# Patient Record
Sex: Female | Born: 1970 | Race: White | Hispanic: No | Marital: Married
Health system: Southern US, Community
[De-identification: ages and names within clinical notes are randomized; demographics above are authoritative.]

---

## 2003-08-28 ENCOUNTER — Inpatient Hospital Stay (HOSPITAL_COMMUNITY): Admission: AD | Admit: 2003-08-28 | Discharge: 2003-08-28 | Payer: Self-pay | Admitting: Obstetrics & Gynecology

## 2003-09-03 ENCOUNTER — Encounter: Admission: RE | Admit: 2003-09-03 | Discharge: 2003-09-03 | Payer: Self-pay | Admitting: *Deleted

## 2003-09-08 ENCOUNTER — Ambulatory Visit (HOSPITAL_COMMUNITY): Admission: RE | Admit: 2003-09-08 | Discharge: 2003-09-08 | Payer: Self-pay | Admitting: *Deleted

## 2003-09-17 ENCOUNTER — Encounter: Admission: RE | Admit: 2003-09-17 | Discharge: 2003-09-17 | Payer: Self-pay | Admitting: *Deleted

## 2003-10-02 ENCOUNTER — Ambulatory Visit (HOSPITAL_COMMUNITY): Admission: RE | Admit: 2003-10-02 | Discharge: 2003-10-02 | Payer: Self-pay | Admitting: Obstetrics and Gynecology

## 2003-10-15 ENCOUNTER — Encounter: Admission: RE | Admit: 2003-10-15 | Discharge: 2003-10-15 | Payer: Self-pay | Admitting: *Deleted

## 2003-10-29 ENCOUNTER — Encounter: Admission: RE | Admit: 2003-10-29 | Discharge: 2003-10-29 | Payer: Self-pay | Admitting: *Deleted

## 2003-11-12 ENCOUNTER — Encounter: Admission: RE | Admit: 2003-11-12 | Discharge: 2003-11-12 | Payer: Self-pay | Admitting: *Deleted

## 2004-09-20 IMAGING — US US OB COMP +14 WK
1 series · 14 of 28 positions shown · non-contrast
Comparison: none

CLINICAL DATA: Assess cervix and dating.

[Series 1: us ob comp +14 wk · 14 of 65 slices shown]
[im 3/65]
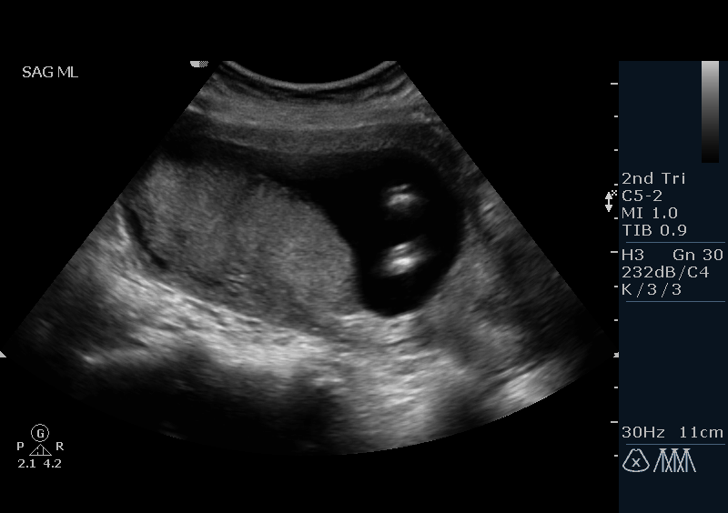
[im 8/65]
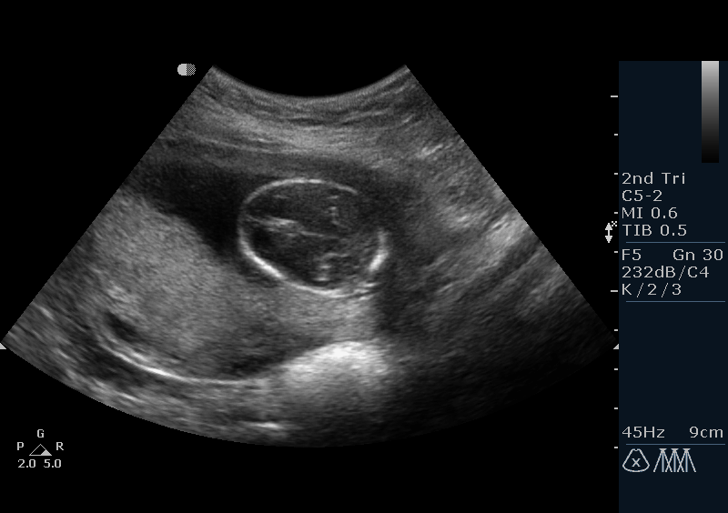
[im 12/65]
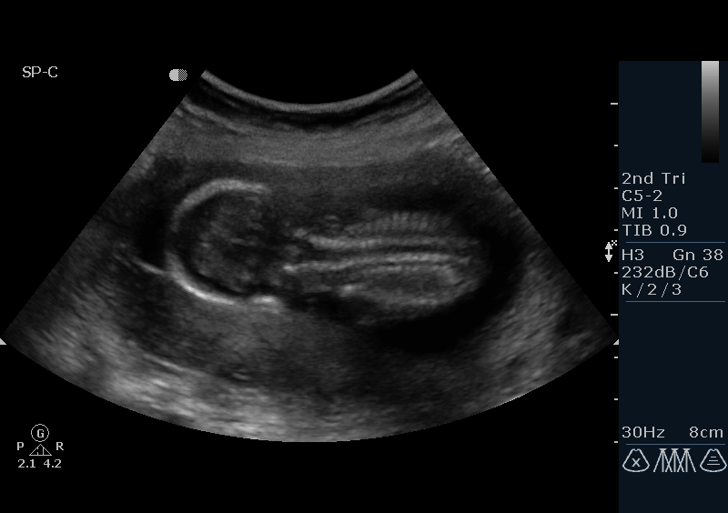
[im 17/65]
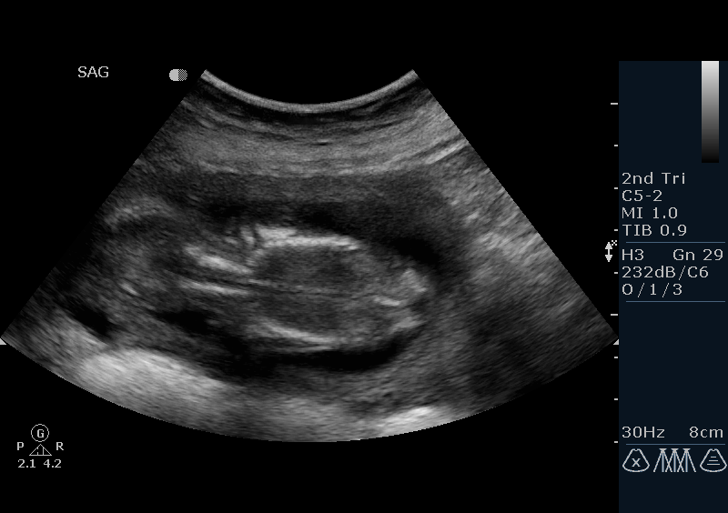
[im 22/65]
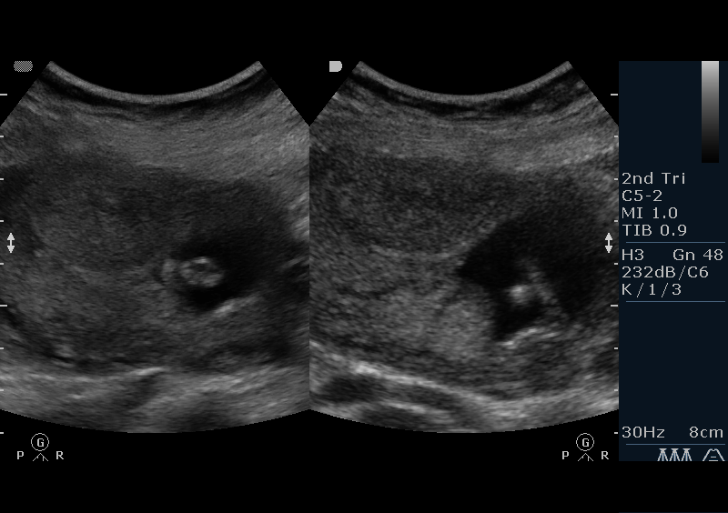
[im 27/65]
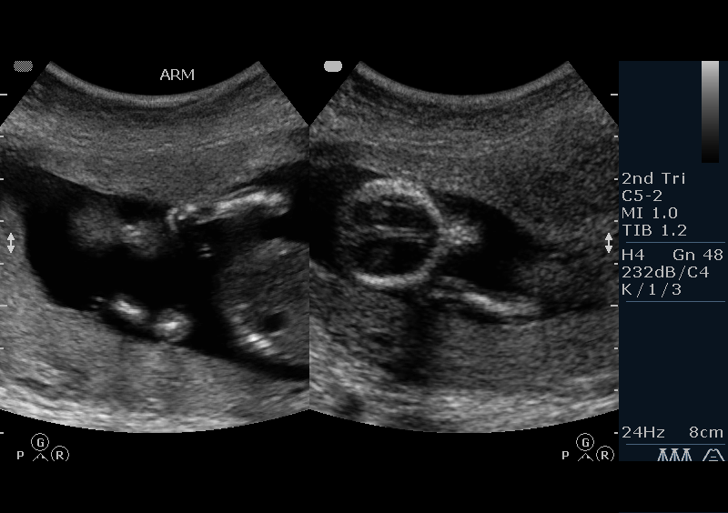
[im 31/65]
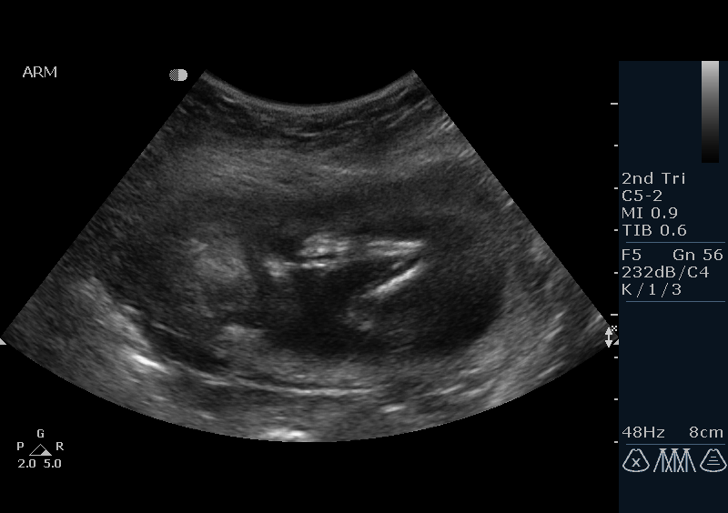
[im 36/65]
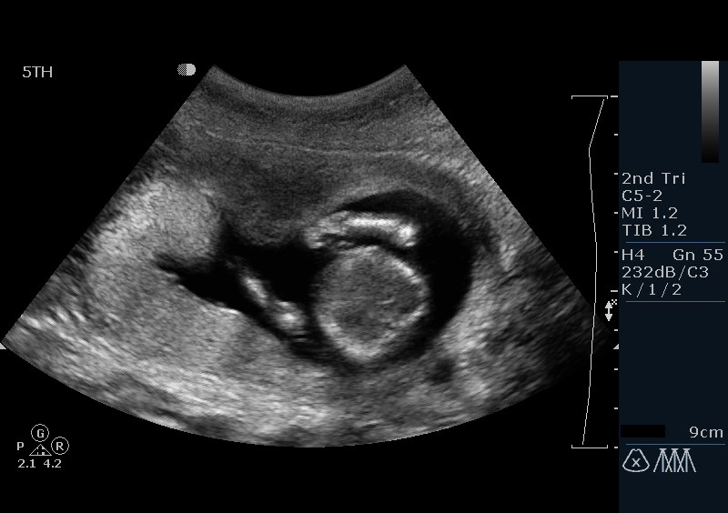
[im 41/65]
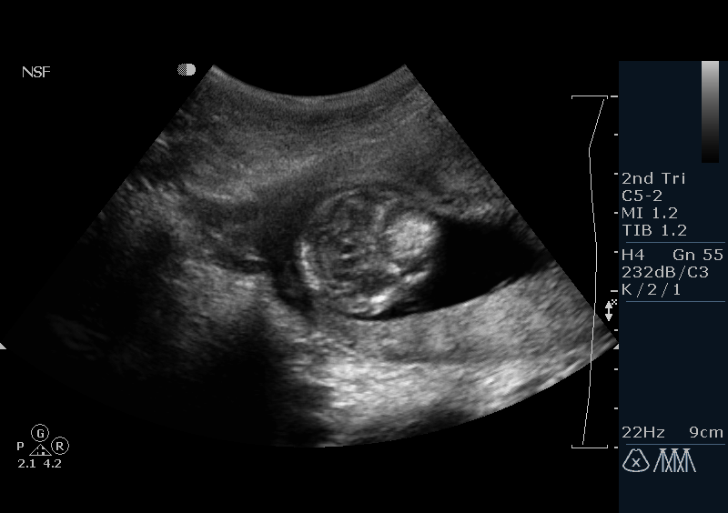
[im 46/65]
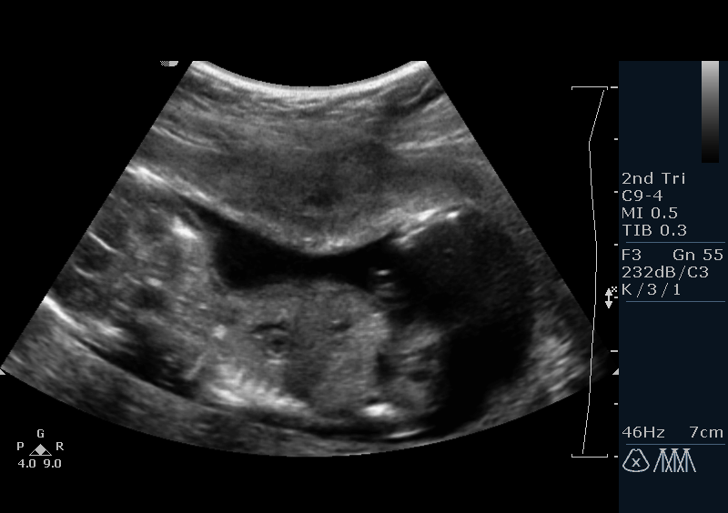
[im 50/65]
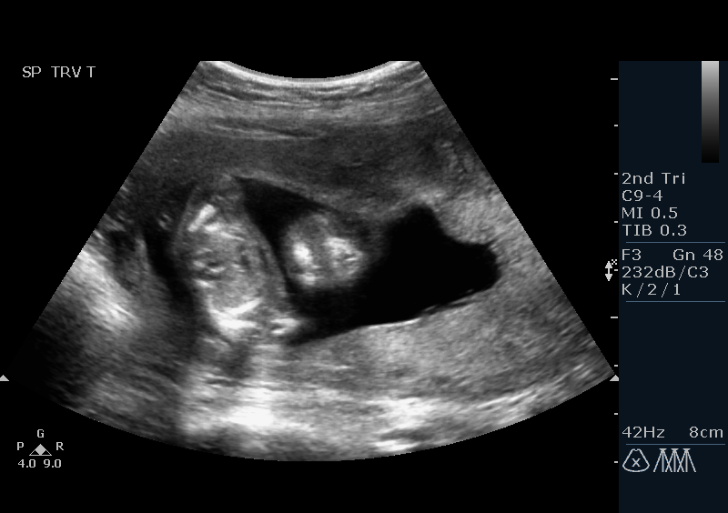
[im 55/65]
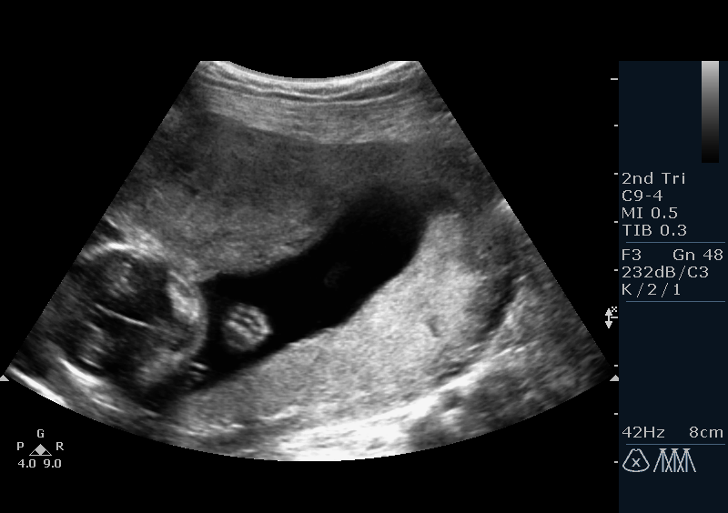
[im 60/65]
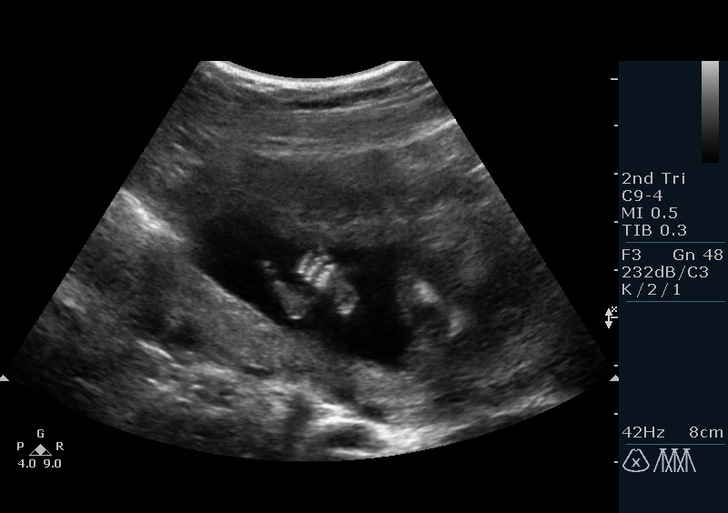
[im 65/65]
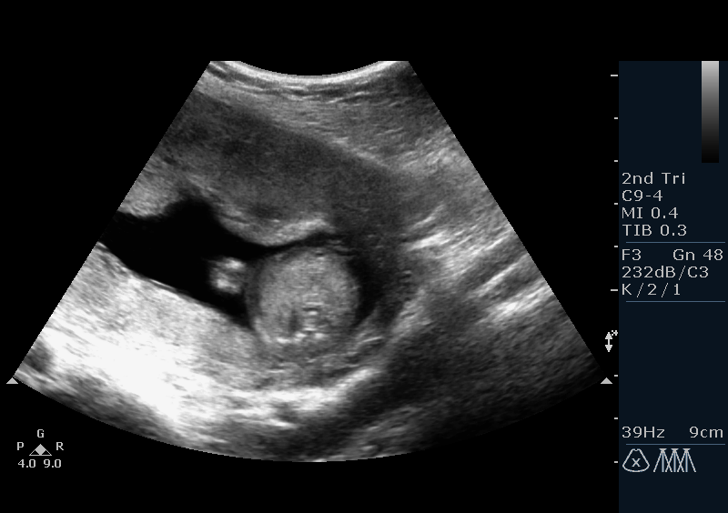

[14 of 28 positions shown; findings below may reference images not displayed]

OBSTETRICAL ULTRASOUND
Number of Fetuses:  1
Heart Rate:  166
Movement:  Yes
Breathing:  No  
Presentation:  Breech
Placental Location:  Posterior
Grade:  I
Previa:  No
Amniotic Fluid (Subjective):  Normal
Amniotic Fluid (Objective):   4 cm Vertical pocket 

FETAL BIOMETRY
BPD:  2.8 cm   15 w 1 d
HC:  10.7 cm  15 w 0 d
AC:  8.9 cm   15 w 0 d
FL:  1.5 cm   14 w 4 d

MEAN GA:  14 w 6 d

  FETAL ANATOMY
Lateral Ventricles:    Visualized 
Thalami/CSP:      Visualized   
Posterior Fossa:  Visualized 
Nuchal Region:    Visualized 
Spine:      Visualized 
4 Chamber Heart on Left:      Visualized 
Stomach on Left:      Visualized 
3 Vessel Cord:    Visualized 
Cord Insertion site:    Visualized 
Kidneys:  Visualized 
Bladder:  Visualized 
Extremities:      Visualized (early)

ADDITIONAL ANATOMY VISUALIZED:   orbits, heel, 5th digit, and male genitalia

Evaluation limited by:  Fetal position and early gestational age 

MATERNAL FINDINGS
Cervix:   3.2 cm Transabdominally
IMPRESSION: Single intrauterine pregnancy demonstrating an estimated gestational age by ultrasound of 14 weeks 6 days.  This is 5 weeks and 5 days behind expected estimated gestational age by LMP of 20 weeks and 4 days and suggests inaccurate dating by LMP.  
An incomplete anatomic exam was possible today due to early gestational age.  Follow-up evaluation in three weeks time would be recommended for an improved anatomic assessment.

## 2004-10-01 IMAGING — US US OB TRANSVAGINAL
1 series · 5 of 5 positions shown · non-contrast
Comparison: none

CLINICAL DATA: Evaluate cervix.  16 weeks estimated gestational age.
 TRANSVAGINAL OBSTETRICAL ULTRASOUND:
 Limited views of the gravid cervix were obtained.  A normal cervical length of 4.0 cm is seen.  No dilatation at the level of the internal cervical os is seen.  Fetal anatomy was not assessed and follow-up evaluation between 18 and 20 weeks would be recommended for full anatomic visualization.
 IMPRESSION
 Normal cervical length.

[Series 1: us ob transvaginal · 5 of 5 slices shown]
[im 1/5]
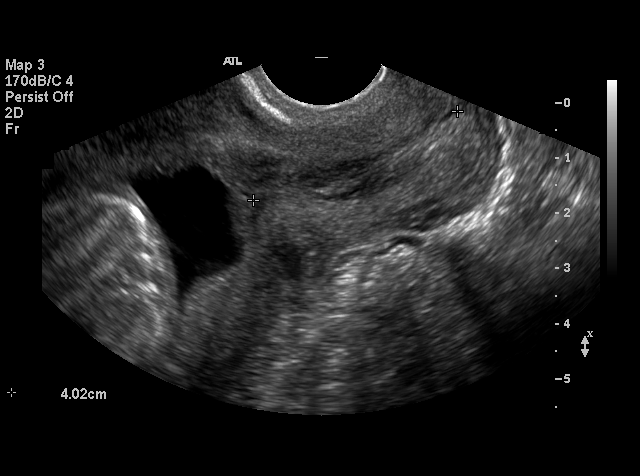
[im 2/5]
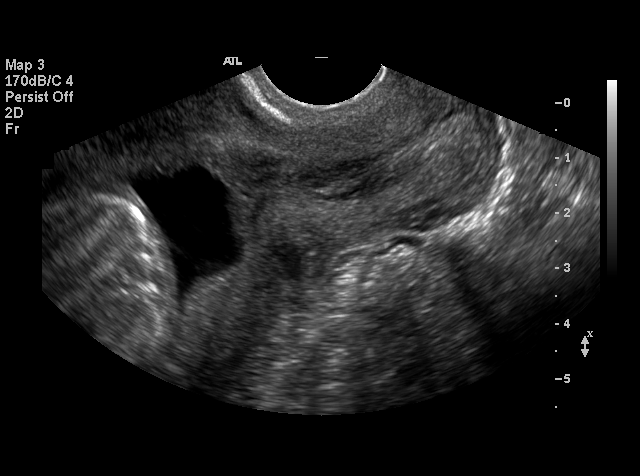
[im 3/5]
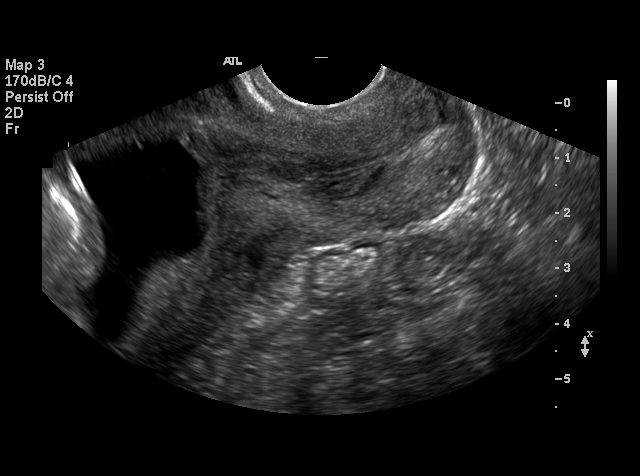
[im 4/5]
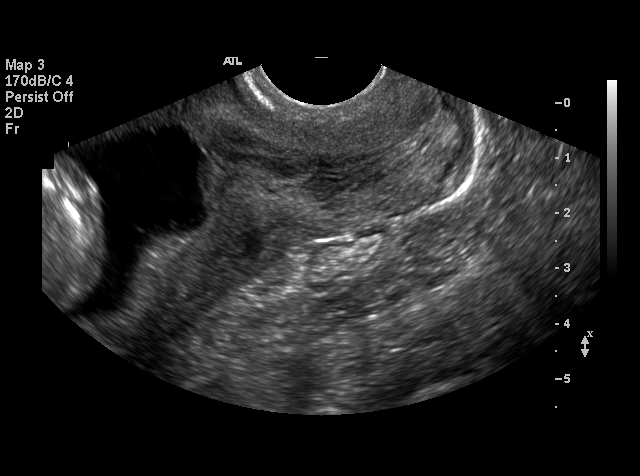
[im 5/5]
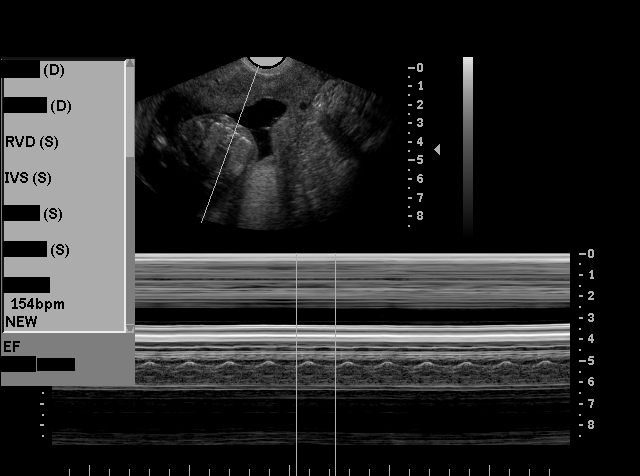

[5 of 5 positions shown; findings below may reference images not displayed]

## 2010-06-06 ENCOUNTER — Encounter: Payer: Self-pay | Admitting: *Deleted

## 2016-07-05 ENCOUNTER — Ambulatory Visit (HOSPITAL_COMMUNITY): Payer: Self-pay | Admitting: Licensed Clinical Social Worker

## 2016-07-26 ENCOUNTER — Ambulatory Visit (INDEPENDENT_AMBULATORY_CARE_PROVIDER_SITE_OTHER): Payer: Medicaid Other | Admitting: Licensed Clinical Social Worker

## 2016-07-26 DIAGNOSIS — F439 Reaction to severe stress, unspecified: Secondary | ICD-10-CM | POA: Diagnosis not present

## 2016-07-26 DIAGNOSIS — F331 Major depressive disorder, recurrent, moderate: Secondary | ICD-10-CM | POA: Diagnosis not present

## 2016-07-26 DIAGNOSIS — F411 Generalized anxiety disorder: Secondary | ICD-10-CM | POA: Diagnosis not present

## 2016-07-27 ENCOUNTER — Encounter (HOSPITAL_COMMUNITY): Payer: Self-pay | Admitting: Licensed Clinical Social Worker

## 2016-07-27 DIAGNOSIS — F411 Generalized anxiety disorder: Secondary | ICD-10-CM | POA: Insufficient documentation

## 2016-07-27 DIAGNOSIS — F331 Major depressive disorder, recurrent, moderate: Secondary | ICD-10-CM | POA: Insufficient documentation

## 2016-07-27 DIAGNOSIS — F439 Reaction to severe stress, unspecified: Secondary | ICD-10-CM | POA: Insufficient documentation
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
## 2016-08-15 ENCOUNTER — Ambulatory Visit (INDEPENDENT_AMBULATORY_CARE_PROVIDER_SITE_OTHER): Payer: Medicaid Other | Admitting: Licensed Clinical Social Worker

## 2016-08-15 DIAGNOSIS — F411 Generalized anxiety disorder: Secondary | ICD-10-CM

## 2016-08-15 DIAGNOSIS — F439 Reaction to severe stress, unspecified: Secondary | ICD-10-CM

## 2016-08-15 DIAGNOSIS — F331 Major depressive disorder, recurrent, moderate: Secondary | ICD-10-CM
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
## 2016-09-06 ENCOUNTER — Ambulatory Visit (HOSPITAL_COMMUNITY): Payer: Self-pay | Admitting: Licensed Clinical Social Worker

## 2016-10-04 ENCOUNTER — Ambulatory Visit (INDEPENDENT_AMBULATORY_CARE_PROVIDER_SITE_OTHER): Payer: Medicaid Other | Admitting: Licensed Clinical Social Worker

## 2016-10-04 DIAGNOSIS — F411 Generalized anxiety disorder: Secondary | ICD-10-CM | POA: Diagnosis not present

## 2016-10-04 DIAGNOSIS — F439 Reaction to severe stress, unspecified: Secondary | ICD-10-CM

## 2016-10-04 DIAGNOSIS — F331 Major depressive disorder, recurrent, moderate: Secondary | ICD-10-CM | POA: Diagnosis not present

## 2016-10-04 NOTE — Progress Notes (Signed)
   THERAPIST PROGRESS NOTE  Session Time: 11:05am-12:10pm  Participation Level: Active  Behavioral Response: Casual  Alert  Pressured speech  Mostly euthymic  Type of Therapy: Individual Therapy  Treatment Goals addressed: Increase self-confidence and decrease guilt  Interventions: Identifying thoughts and feelings, exploring setting boundaries in relationships  Suicidal/Homicidal: Denied both  Therapist Interventions: Gathered information about significant events and changes in mood and functioning since last seen in early April.  Sought clarification of patient's thoughts and feelings regarding her relationships with different individuals in her life.  Prompted her to consider how distancing herself from a friend who has been controlling and also encouraged patient to engage in impulsive behavior which patient claims is not characteristic of her.  Commented on how patient's life seems to be complicated by the fact that she holds onto relationships with people regardless of how they treat her.      Summary: Described in detail one particular night when she was very impulsive and easily influenced by others.  Indicated she feels conflicted about her relationships with the different people involved.  At one point she described the events of that night as being "the best night of her life."  At other points she indicated she felt taken advantage of.   Reflected on how she is attracted to men who are "bad boys" and less so to men who treat her well.  There is one man in particular she says she is in love with but he has made it clear to her he doesn't want to be in a committed relationship.   Commented on how she has thought about moving away from the area so that she is not influenced by these different people so much.  Noted [place] so she is well known in the area.  A part of her would like to get a fresh start.     Plan: Consider having her complete a PCL-5 to assess  for PTSD related symptoms.  Diagnosis: Major Depressive Disorder, recurrent, moderate                          Trauma and stressor related disorder                          GAD    Marilu FavreSolomon, Sarah A, LCSW 10/04/2016

## 2016-10-27 ENCOUNTER — Ambulatory Visit (INDEPENDENT_AMBULATORY_CARE_PROVIDER_SITE_OTHER): Payer: Medicaid Other | Admitting: Licensed Clinical Social Worker

## 2016-10-27 DIAGNOSIS — F411 Generalized anxiety disorder: Secondary | ICD-10-CM | POA: Diagnosis not present

## 2016-10-27 DIAGNOSIS — F439 Reaction to severe stress, unspecified: Secondary | ICD-10-CM | POA: Diagnosis not present

## 2016-10-27 DIAGNOSIS — F331 Major depressive disorder, recurrent, moderate: Secondary | ICD-10-CM | POA: Diagnosis not present

## 2016-10-27 NOTE — Progress Notes (Signed)
   THERAPIST PROGRESS NOTE  Session Time: 10:15am-11:00am  Participation Level: Active  Behavioral Response: Casual  Alert  Anxious  Type of Therapy: Individual Therapy  Treatment Goals addressed: Increase self-confidence and decrease guilt  Interventions: Problem solving  Suicidal/Homicidal: Denied both  Therapist Interventions: Explored patient's feelings for a man who has been a part of her life for a long time.  Discussed pros and cons of reaching out to him in an effort to reestablish their relationship.  Encouraged patient to talk about how she has come to be so trusting of this individual when in general it is hard for her to trust anyone.  Provided positive feedback regarding patient's idea to communicate her thoughts and feelings to him in a letter.     Summary:  Reports she has been in love with this man for many years.  The last communication she had from him was not pleasant.  Afterwards she tried texting him but they wouldn't go through.  Since then she hasn't been able to stop thinking about him.  Described how when she spends time with him she feels safe, like she can be herself.  The situation is complicated by the fact that this man is also friends with her (ex) husband and brother-in-law.  Believes that he is afraid of them finding out about the intimate nature of their relationship.  Patient talked about feeling like it isn't fair for her to be miserable just because they wouldn't approve of the relationship.       Plan: Consider having her complete a PCL-5 to assess for PTSD related symptoms.  Diagnosis: Major Depressive Disorder, recurrent, moderate                          Trauma and stressor related disorder                          GAD    Marilu FavreSolomon, Karmin Kasprzak A, LCSW 10/27/2016

## 2016-11-15 ENCOUNTER — Ambulatory Visit (HOSPITAL_COMMUNITY): Payer: Self-pay | Admitting: Licensed Clinical Social Worker

## 2016-11-18 ENCOUNTER — Ambulatory Visit (INDEPENDENT_AMBULATORY_CARE_PROVIDER_SITE_OTHER): Payer: Medicaid Other | Admitting: Licensed Clinical Social Worker

## 2016-11-18 DIAGNOSIS — F411 Generalized anxiety disorder: Secondary | ICD-10-CM | POA: Diagnosis not present

## 2016-11-18 DIAGNOSIS — F439 Reaction to severe stress, unspecified: Secondary | ICD-10-CM

## 2016-11-18 DIAGNOSIS — F331 Major depressive disorder, recurrent, moderate: Secondary | ICD-10-CM | POA: Diagnosis not present
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
## 2016-12-06 ENCOUNTER — Ambulatory Visit (INDEPENDENT_AMBULATORY_CARE_PROVIDER_SITE_OTHER): Payer: Medicaid Other | Admitting: Licensed Clinical Social Worker

## 2016-12-06 DIAGNOSIS — F4310 Post-traumatic stress disorder, unspecified: Secondary | ICD-10-CM

## 2016-12-06 DIAGNOSIS — F411 Generalized anxiety disorder: Secondary | ICD-10-CM

## 2016-12-06 DIAGNOSIS — F331 Major depressive disorder, recurrent, moderate: Secondary | ICD-10-CM | POA: Diagnosis not present
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
## 2016-12-23 ENCOUNTER — Ambulatory Visit (INDEPENDENT_AMBULATORY_CARE_PROVIDER_SITE_OTHER): Payer: Medicaid Other | Admitting: Licensed Clinical Social Worker

## 2016-12-23 DIAGNOSIS — F4310 Post-traumatic stress disorder, unspecified: Secondary | ICD-10-CM

## 2016-12-23 DIAGNOSIS — F331 Major depressive disorder, recurrent, moderate: Secondary | ICD-10-CM

## 2016-12-23 DIAGNOSIS — F411 Generalized anxiety disorder: Secondary | ICD-10-CM

## 2016-12-26 NOTE — Progress Notes (Signed)
   THERAPIST PROGRESS NOTE  Session Time: 10:03-11:05am  Participation Level: Active  Behavioral Response: Casual  Alert  Anxious  Type of Therapy: Individual Therapy  Treatment Goals addressed: Increase self-confidence and decrease guilt  Interventions: Assessment, Strengths based  Suicidal/Homicidal: Denied both  Therapist Interventions:   Discussed how patient is dissatisfied with her work life.  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  Suggested that her obsessive thoughts about her relationship with her husband are getting in the way of her being able to focus on what she needs.  Explained that it is possible to learn to let go of obsessive thoughts by practicing something called mindfulness.  Suggested they devote her next session to learning about mindfulness.     Summary:  Reported "I'm not happy at my job.  I feel like I don't belong.". Admitted she misses working at the hospital.  Reflected on how on the psych ward she felt valuable and appreciated.  Her CNA license expired over two years ago.  At one point she pursued renewing it but was discouraged from doing so by her husband.  Noted he was not consistent in his support of her working.  Indicated she feels like she can't pursue what she really wants because she is always trying to consider what her husband would want her to do.  Reported she has cried herself to sleep every night as she thinks about the state of her marriage.  Agreed it would be beneficial to learn how to let things go.     Plan: Next time introduce mindfulness.  Diagnosis: PTSD                         Major Depressive Disorder, recurrent, moderate                         GAD    Darrin LuisSolomon, [facility], LCSW 12/23/2016

## 2017-01-10 ENCOUNTER — Ambulatory Visit (INDEPENDENT_AMBULATORY_CARE_PROVIDER_SITE_OTHER): Payer: Medicaid Other | Admitting: Licensed Clinical Social Worker

## 2017-01-10 DIAGNOSIS — F4310 Post-traumatic stress disorder, unspecified: Secondary | ICD-10-CM | POA: Diagnosis not present

## 2017-01-10 DIAGNOSIS — F331 Major depressive disorder, recurrent, moderate: Secondary | ICD-10-CM | POA: Diagnosis not present

## 2017-01-10 DIAGNOSIS — F411 Generalized anxiety disorder: Secondary | ICD-10-CM | POA: Diagnosis not present
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
## 2017-01-24 ENCOUNTER — Ambulatory Visit (HOSPITAL_COMMUNITY): Payer: Self-pay | Admitting: Licensed Clinical Social Worker

## 2017-02-07 ENCOUNTER — Ambulatory Visit (INDEPENDENT_AMBULATORY_CARE_PROVIDER_SITE_OTHER): Payer: Medicaid Other | Admitting: Licensed Clinical Social Worker

## 2017-02-07 DIAGNOSIS — F4310 Post-traumatic stress disorder, unspecified: Secondary | ICD-10-CM

## 2017-02-07 DIAGNOSIS — F411 Generalized anxiety disorder: Secondary | ICD-10-CM

## 2017-02-07 DIAGNOSIS — F331 Major depressive disorder, recurrent, moderate: Secondary | ICD-10-CM
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
## 2017-03-06 ENCOUNTER — Ambulatory Visit (HOSPITAL_COMMUNITY): Payer: Self-pay | Admitting: Licensed Clinical Social Worker

## 2018-07-24 ENCOUNTER — Ambulatory Visit (HOSPITAL_COMMUNITY): Payer: Medicaid Other | Admitting: Licensed Clinical Social Worker

## 2018-08-13 ENCOUNTER — Ambulatory Visit (HOSPITAL_COMMUNITY): Payer: Medicaid Other | Admitting: Licensed Clinical Social Worker

## 2018-08-15 ENCOUNTER — Ambulatory Visit (INDEPENDENT_AMBULATORY_CARE_PROVIDER_SITE_OTHER): Payer: Medicaid Other | Admitting: Licensed Clinical Social Worker

## 2018-08-15 DIAGNOSIS — F332 Major depressive disorder, recurrent severe without psychotic features: Secondary | ICD-10-CM | POA: Diagnosis not present

## 2018-08-15 DIAGNOSIS — F411 Generalized anxiety disorder: Secondary | ICD-10-CM

## 2018-08-15 DIAGNOSIS — F439 Reaction to severe stress, unspecified: Secondary | ICD-10-CM | POA: Diagnosis not present
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
## 2018-08-22 ENCOUNTER — Ambulatory Visit (INDEPENDENT_AMBULATORY_CARE_PROVIDER_SITE_OTHER): Payer: Medicaid Other | Admitting: Licensed Clinical Social Worker

## 2018-08-22 DIAGNOSIS — F439 Reaction to severe stress, unspecified: Secondary | ICD-10-CM

## 2018-08-22 DIAGNOSIS — F331 Major depressive disorder, recurrent, moderate: Secondary | ICD-10-CM | POA: Diagnosis not present

## 2018-08-22 DIAGNOSIS — F411 Generalized anxiety disorder: Secondary | ICD-10-CM

## 2018-08-22 NOTE — Progress Notes (Signed)
  Virtual Visit via Video Note  I connected with Elisabet Niazi on 08/22/18 at  2:00 PM EDT by a video enabled telemedicine application and verified that I am speaking with the correct person using two identifiers.   I discussed the limitations of evaluation and management by telemedicine and the availability of in person appointments. The patient expressed understanding and agreed to proceed.  History of Present Illness: Patient shares that girl is out of her house as of last Friday. Relates that things are "a little better". When asked about her mood she explains that her daughter has had a hard time with this and has set up an appointment for psychiatrist and has gotten her emotional support dog.     Observations/Objective: Speech normal rate, volume, rhythm.  Thought process logical and goal directed.  Mood euthymic, some anxiety   Assessment and Plan: Therapist related that this is helpful steps to address daughter's issues. Treatment session ended early because both therapist and patient had difficulty hearing each other by video. Plan is to go back to [employer] as sound was clearer using this format for next session on 08/31/18 at 9:00 AM. Attempted to use the telephone but still difficulty in hearing each other, note-patient has a hearing problem   Follow Up Instructions:    I discussed the assessment and treatment plan with the patient. The patient was provided an opportunity to ask questions and all were answered. The patient agreed with the plan and demonstrated an understanding of the instructions.   The patient was advised to call back or seek an in-person evaluation if the symptoms worsen or if the condition fails to improve as anticipated.  I provided 20 minutes of non-face-to-face time during this encounter.   Coolidge Breeze, LCSW

## 2018-08-31 ENCOUNTER — Ambulatory Visit (INDEPENDENT_AMBULATORY_CARE_PROVIDER_SITE_OTHER): Payer: Medicaid Other | Admitting: Licensed Clinical Social Worker

## 2018-08-31 ENCOUNTER — Other Ambulatory Visit: Payer: Self-pay

## 2018-08-31 DIAGNOSIS — F331 Major depressive disorder, recurrent, moderate: Secondary | ICD-10-CM | POA: Diagnosis not present

## 2018-08-31 DIAGNOSIS — F411 Generalized anxiety disorder: Secondary | ICD-10-CM | POA: Diagnosis not present

## 2018-08-31 DIAGNOSIS — F439 Reaction to severe stress, unspecified: Secondary | ICD-10-CM
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
## 2018-09-18 ENCOUNTER — Ambulatory Visit (HOSPITAL_COMMUNITY): Payer: Medicaid Other | Admitting: Licensed Clinical Social Worker
